# Patient Record
Sex: Male | Born: 1940 | Race: White | Hispanic: No | Marital: Married | Smoking: Former smoker
Health system: Southern US, Community
[De-identification: ages and names within clinical notes are randomized; demographics above are authoritative.]

## PROBLEM LIST (undated history)

## (undated) DIAGNOSIS — C801 Malignant (primary) neoplasm, unspecified: Secondary | ICD-10-CM

## (undated) HISTORY — PX: COLON SURGERY: SHX602

---

## 2004-07-09 ENCOUNTER — Emergency Department (HOSPITAL_COMMUNITY): Admission: EM | Admit: 2004-07-09 | Discharge: 2004-07-09 | Payer: Self-pay | Admitting: Emergency Medicine

## 2004-07-10 ENCOUNTER — Inpatient Hospital Stay (HOSPITAL_COMMUNITY): Admission: RE | Admit: 2004-07-10 | Discharge: 2004-07-14 | Payer: Self-pay | Admitting: General Surgery

## 2004-07-29 ENCOUNTER — Encounter (HOSPITAL_COMMUNITY): Admission: RE | Admit: 2004-07-29 | Discharge: 2004-08-07 | Payer: Self-pay | Admitting: Oncology

## 2004-07-29 ENCOUNTER — Encounter: Admission: RE | Admit: 2004-07-29 | Discharge: 2004-08-07 | Payer: Self-pay | Admitting: Oncology

## 2004-10-27 ENCOUNTER — Encounter (HOSPITAL_COMMUNITY): Admission: RE | Admit: 2004-10-27 | Discharge: 2004-11-06 | Payer: Self-pay | Admitting: Oncology

## 2004-10-27 ENCOUNTER — Ambulatory Visit (HOSPITAL_COMMUNITY): Payer: Self-pay | Admitting: Oncology

## 2004-10-27 ENCOUNTER — Encounter: Admission: RE | Admit: 2004-10-27 | Discharge: 2004-11-06 | Payer: Self-pay | Admitting: Oncology

## 2004-12-01 ENCOUNTER — Encounter (HOSPITAL_COMMUNITY): Admission: RE | Admit: 2004-12-01 | Discharge: 2004-12-31 | Payer: Self-pay | Admitting: Oncology

## 2004-12-01 ENCOUNTER — Encounter: Admission: RE | Admit: 2004-12-01 | Discharge: 2004-12-01 | Payer: Self-pay | Admitting: Oncology

## 2005-02-08 ENCOUNTER — Ambulatory Visit (HOSPITAL_COMMUNITY): Admission: RE | Admit: 2005-02-08 | Discharge: 2005-02-08 | Payer: Self-pay | Admitting: Family Medicine

## 2005-03-01 ENCOUNTER — Ambulatory Visit (HOSPITAL_COMMUNITY): Payer: Self-pay | Admitting: Oncology

## 2005-03-01 ENCOUNTER — Encounter: Admission: RE | Admit: 2005-03-01 | Discharge: 2005-03-01 | Payer: Self-pay | Admitting: Oncology

## 2005-03-01 ENCOUNTER — Encounter (HOSPITAL_COMMUNITY): Admission: RE | Admit: 2005-03-01 | Discharge: 2005-03-31 | Payer: Self-pay | Admitting: Oncology

## 2005-05-24 ENCOUNTER — Ambulatory Visit (HOSPITAL_COMMUNITY): Payer: Self-pay | Admitting: Oncology

## 2005-05-24 ENCOUNTER — Encounter: Admission: RE | Admit: 2005-05-24 | Discharge: 2005-05-24 | Payer: Self-pay | Admitting: Oncology

## 2005-05-24 ENCOUNTER — Encounter (HOSPITAL_COMMUNITY): Admission: RE | Admit: 2005-05-24 | Discharge: 2005-06-23 | Payer: Self-pay | Admitting: Oncology

## 2005-07-30 ENCOUNTER — Encounter: Admission: RE | Admit: 2005-07-30 | Discharge: 2005-08-07 | Payer: Self-pay | Admitting: Oncology

## 2005-07-30 ENCOUNTER — Encounter (HOSPITAL_COMMUNITY): Admission: RE | Admit: 2005-07-30 | Discharge: 2005-08-07 | Payer: Self-pay | Admitting: Oncology

## 2005-07-30 ENCOUNTER — Ambulatory Visit (HOSPITAL_COMMUNITY): Payer: Self-pay | Admitting: Oncology

## 2006-01-19 ENCOUNTER — Ambulatory Visit (HOSPITAL_COMMUNITY): Admission: RE | Admit: 2006-01-19 | Discharge: 2006-01-19 | Payer: Self-pay | Admitting: General Surgery

## 2006-01-21 ENCOUNTER — Ambulatory Visit (HOSPITAL_COMMUNITY): Payer: Self-pay | Admitting: Oncology

## 2006-01-21 ENCOUNTER — Encounter: Admission: RE | Admit: 2006-01-21 | Discharge: 2006-01-21 | Payer: Self-pay | Admitting: Oncology

## 2006-01-21 ENCOUNTER — Encounter (HOSPITAL_COMMUNITY): Admission: RE | Admit: 2006-01-21 | Discharge: 2006-02-20 | Payer: Self-pay | Admitting: Oncology

## 2006-04-22 ENCOUNTER — Ambulatory Visit (HOSPITAL_COMMUNITY): Payer: Self-pay | Admitting: Oncology

## 2006-04-22 ENCOUNTER — Encounter: Admission: RE | Admit: 2006-04-22 | Discharge: 2006-04-22 | Payer: Self-pay | Admitting: Oncology

## 2006-04-22 ENCOUNTER — Encounter (HOSPITAL_COMMUNITY): Admission: RE | Admit: 2006-04-22 | Discharge: 2006-05-22 | Payer: Self-pay | Admitting: Oncology

## 2006-07-15 ENCOUNTER — Ambulatory Visit (HOSPITAL_COMMUNITY): Payer: Self-pay | Admitting: Oncology

## 2006-07-15 ENCOUNTER — Encounter: Admission: RE | Admit: 2006-07-15 | Discharge: 2006-08-05 | Payer: Self-pay | Admitting: Oncology

## 2006-07-15 ENCOUNTER — Encounter (HOSPITAL_COMMUNITY): Admission: RE | Admit: 2006-07-15 | Discharge: 2006-08-05 | Payer: Self-pay | Admitting: Oncology

## 2006-10-14 ENCOUNTER — Ambulatory Visit (HOSPITAL_COMMUNITY): Payer: Self-pay | Admitting: Oncology

## 2006-10-14 ENCOUNTER — Encounter (HOSPITAL_COMMUNITY): Admission: RE | Admit: 2006-10-14 | Discharge: 2006-11-07 | Payer: Self-pay | Admitting: Oncology

## 2007-01-13 ENCOUNTER — Encounter (HOSPITAL_COMMUNITY): Admission: RE | Admit: 2007-01-13 | Discharge: 2007-02-12 | Payer: Self-pay | Admitting: Oncology

## 2007-01-13 ENCOUNTER — Ambulatory Visit (HOSPITAL_COMMUNITY): Payer: Self-pay | Admitting: Oncology

## 2007-04-14 ENCOUNTER — Encounter (HOSPITAL_COMMUNITY): Admission: RE | Admit: 2007-04-14 | Discharge: 2007-05-14 | Payer: Self-pay | Admitting: Oncology

## 2007-04-14 ENCOUNTER — Ambulatory Visit (HOSPITAL_COMMUNITY): Payer: Self-pay | Admitting: Oncology

## 2007-07-06 ENCOUNTER — Emergency Department (HOSPITAL_COMMUNITY): Admission: EM | Admit: 2007-07-06 | Discharge: 2007-07-06 | Payer: Self-pay | Admitting: Emergency Medicine

## 2007-07-17 ENCOUNTER — Ambulatory Visit (HOSPITAL_COMMUNITY): Payer: Self-pay | Admitting: Oncology

## 2007-07-17 ENCOUNTER — Encounter (HOSPITAL_COMMUNITY): Admission: RE | Admit: 2007-07-17 | Discharge: 2007-08-08 | Payer: Self-pay | Admitting: Oncology

## 2007-08-28 ENCOUNTER — Encounter (HOSPITAL_COMMUNITY): Admission: RE | Admit: 2007-08-28 | Discharge: 2007-09-27 | Payer: Self-pay | Admitting: Neurosurgery

## 2007-09-28 ENCOUNTER — Encounter (HOSPITAL_COMMUNITY): Admission: RE | Admit: 2007-09-28 | Discharge: 2007-10-28 | Payer: Self-pay | Admitting: Neurosurgery

## 2007-10-16 ENCOUNTER — Encounter (HOSPITAL_COMMUNITY): Admission: RE | Admit: 2007-10-16 | Discharge: 2007-11-08 | Payer: Self-pay | Admitting: Oncology

## 2007-10-16 ENCOUNTER — Ambulatory Visit (HOSPITAL_COMMUNITY): Payer: Self-pay | Admitting: Oncology

## 2007-11-07 ENCOUNTER — Ambulatory Visit (HOSPITAL_COMMUNITY): Admission: RE | Admit: 2007-11-07 | Discharge: 2007-11-07 | Payer: Self-pay | Admitting: Neurosurgery

## 2007-11-10 ENCOUNTER — Encounter (HOSPITAL_COMMUNITY): Admission: RE | Admit: 2007-11-10 | Discharge: 2007-12-10 | Payer: Self-pay | Admitting: Neurosurgery

## 2008-01-17 ENCOUNTER — Encounter (HOSPITAL_COMMUNITY): Admission: RE | Admit: 2008-01-17 | Discharge: 2008-02-06 | Payer: Self-pay | Admitting: Oncology

## 2008-01-17 ENCOUNTER — Ambulatory Visit (HOSPITAL_COMMUNITY): Payer: Self-pay | Admitting: Oncology

## 2008-02-12 ENCOUNTER — Encounter (HOSPITAL_COMMUNITY): Admission: RE | Admit: 2008-02-12 | Discharge: 2008-03-13 | Payer: Self-pay | Admitting: Oncology

## 2008-04-17 ENCOUNTER — Encounter (HOSPITAL_COMMUNITY): Admission: RE | Admit: 2008-04-17 | Discharge: 2008-05-17 | Payer: Self-pay | Admitting: Oncology

## 2008-04-17 ENCOUNTER — Ambulatory Visit (HOSPITAL_COMMUNITY): Payer: Self-pay | Admitting: Oncology

## 2008-07-19 ENCOUNTER — Ambulatory Visit (HOSPITAL_COMMUNITY): Payer: Self-pay | Admitting: Oncology

## 2008-07-19 ENCOUNTER — Encounter (HOSPITAL_COMMUNITY): Admission: RE | Admit: 2008-07-19 | Discharge: 2008-08-05 | Payer: Self-pay | Admitting: Oncology

## 2008-07-30 IMAGING — CR DG ELBOW COMPLETE 3+V*L*
2 series · 2 of 2 positions shown · non-contrast
Comparison: None

CLINICAL DATA: Posterior left elbow pain, recent trauma, past
history colon cancer

LEFT ELBOW - COMPLETE 3+ VIEWS

[view not recorded (1 of 2)]
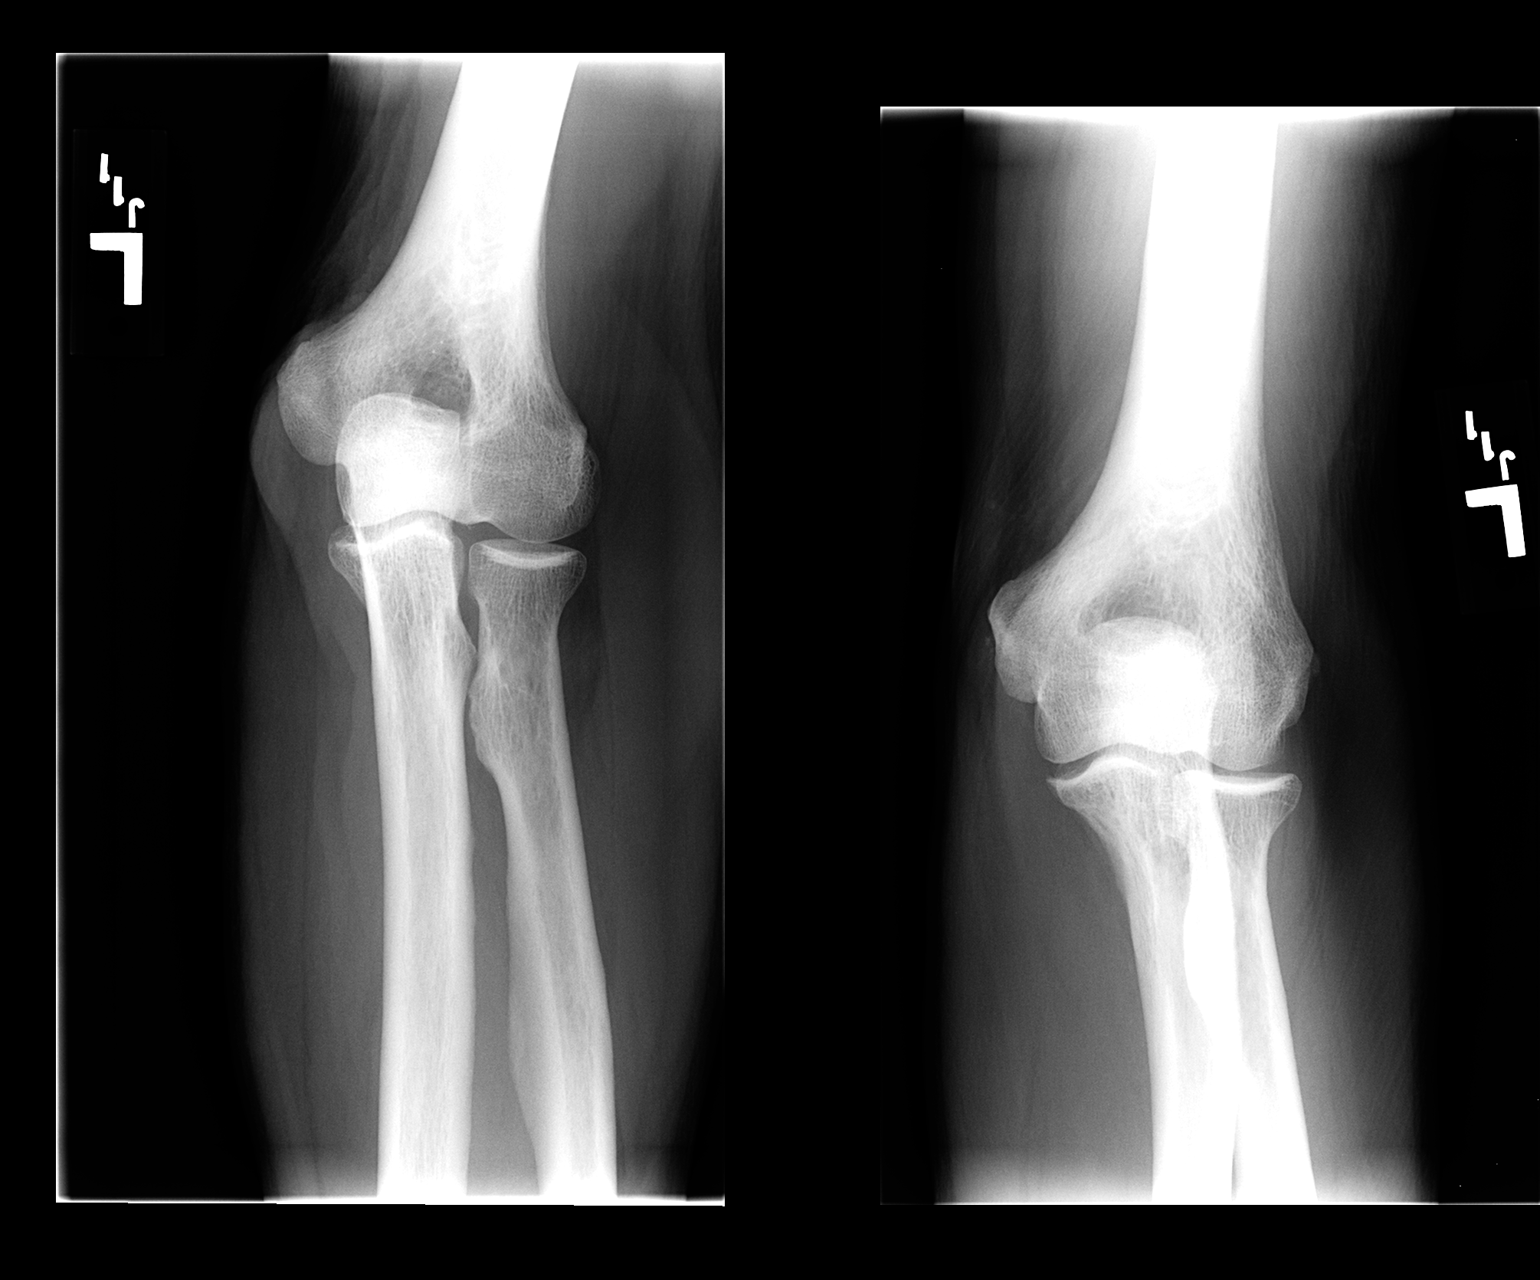

[view not recorded (2 of 2)]
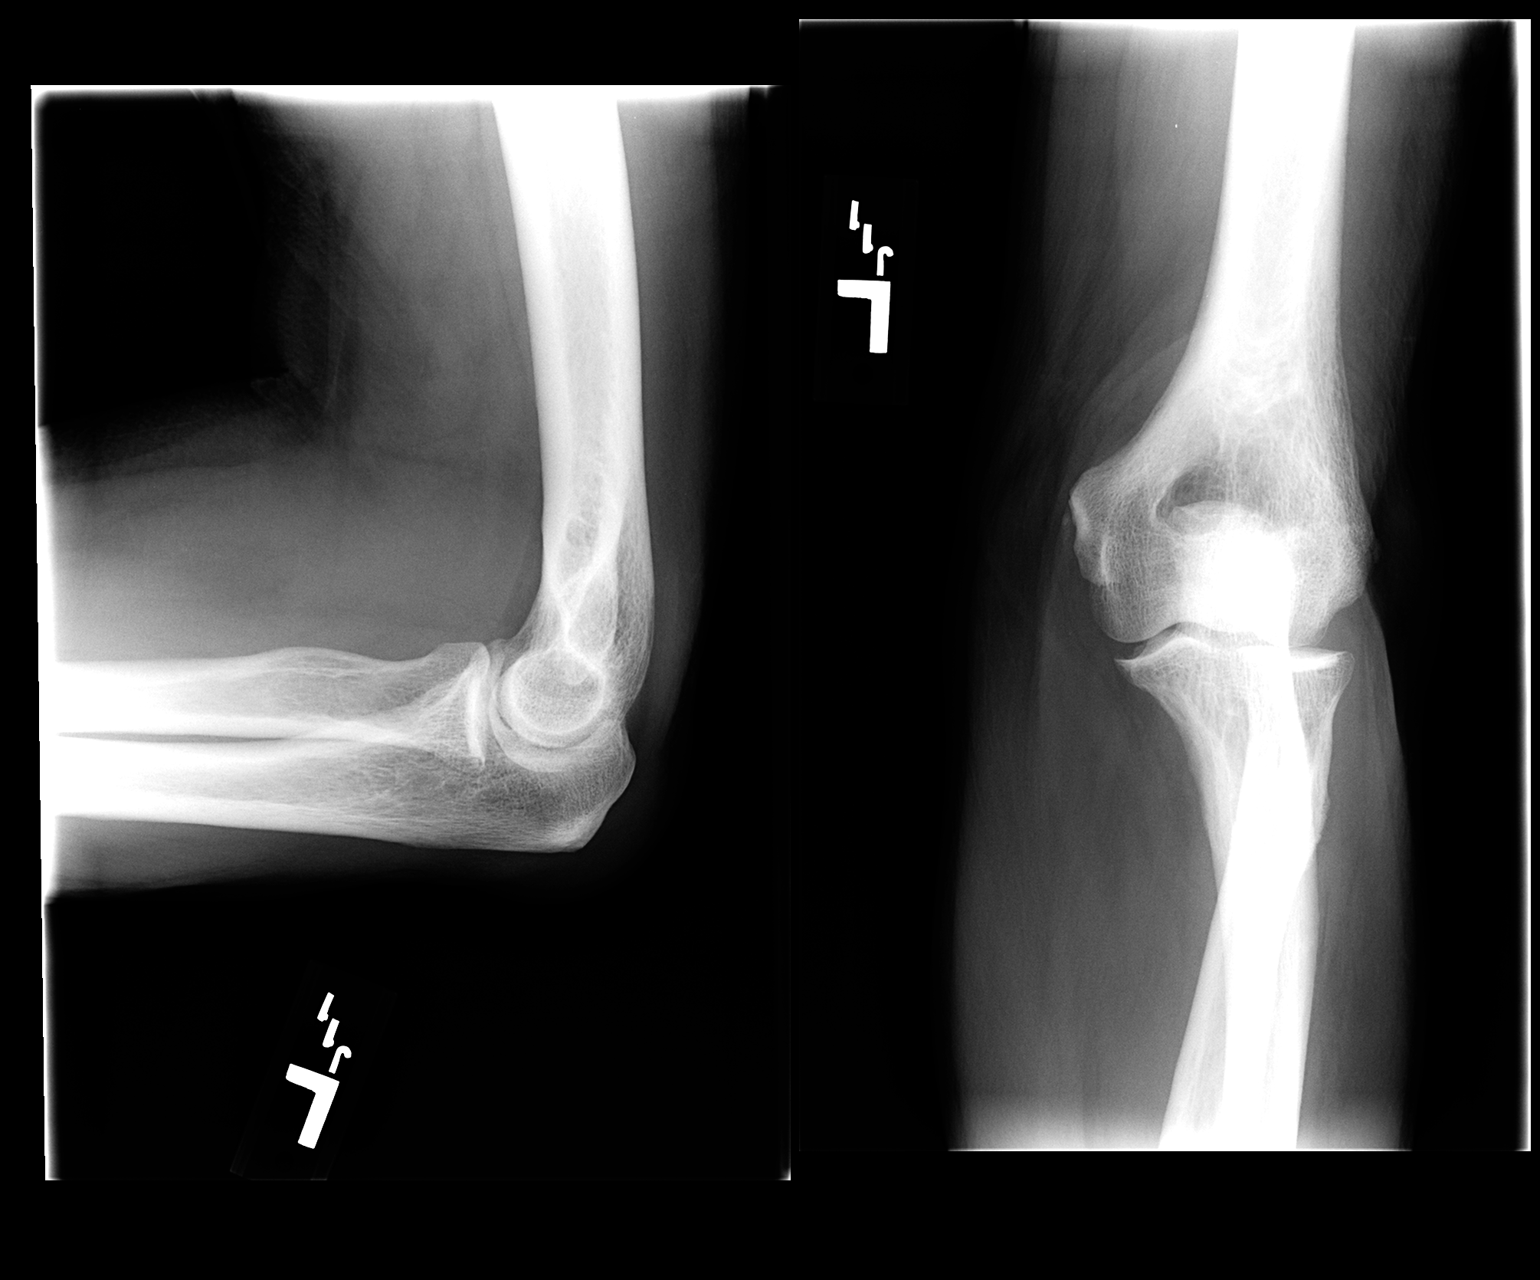

[2 of 2 positions shown; findings below may reference images not displayed]

FINDINGS: Bone mineralization normal.
Joint spaces preserved.
No fracture, dislocation, or bone destruction.
No joint effusion.
IMPRESSION: No acute bony abnormalities.

## 2008-08-22 ENCOUNTER — Ambulatory Visit: Payer: Self-pay | Admitting: Internal Medicine

## 2008-09-04 ENCOUNTER — Encounter: Payer: Self-pay | Admitting: Internal Medicine

## 2008-09-04 ENCOUNTER — Ambulatory Visit (HOSPITAL_COMMUNITY): Admission: RE | Admit: 2008-09-04 | Discharge: 2008-09-04 | Payer: Self-pay | Admitting: Internal Medicine

## 2008-09-04 ENCOUNTER — Ambulatory Visit: Payer: Self-pay | Admitting: Internal Medicine

## 2008-10-18 ENCOUNTER — Encounter (HOSPITAL_COMMUNITY): Admission: RE | Admit: 2008-10-18 | Discharge: 2008-11-06 | Payer: Self-pay | Admitting: Oncology

## 2008-10-18 ENCOUNTER — Ambulatory Visit (HOSPITAL_COMMUNITY): Payer: Self-pay | Admitting: Oncology

## 2009-01-17 ENCOUNTER — Encounter (HOSPITAL_COMMUNITY): Admission: RE | Admit: 2009-01-17 | Discharge: 2009-02-16 | Payer: Self-pay | Admitting: Oncology

## 2009-01-17 ENCOUNTER — Ambulatory Visit (HOSPITAL_COMMUNITY): Payer: Self-pay | Admitting: Oncology

## 2009-03-14 ENCOUNTER — Ambulatory Visit (HOSPITAL_COMMUNITY): Payer: Self-pay | Admitting: Oncology

## 2009-04-30 ENCOUNTER — Encounter (HOSPITAL_COMMUNITY): Admission: RE | Admit: 2009-04-30 | Discharge: 2009-05-30 | Payer: Self-pay | Admitting: Oncology

## 2009-04-30 ENCOUNTER — Ambulatory Visit (HOSPITAL_COMMUNITY): Payer: Self-pay | Admitting: Oncology

## 2009-07-30 ENCOUNTER — Ambulatory Visit (HOSPITAL_COMMUNITY): Payer: Self-pay | Admitting: Oncology

## 2009-07-30 ENCOUNTER — Encounter (HOSPITAL_COMMUNITY): Admission: RE | Admit: 2009-07-30 | Discharge: 2009-08-06 | Payer: Self-pay | Admitting: Oncology

## 2010-01-16 ENCOUNTER — Ambulatory Visit (HOSPITAL_COMMUNITY): Payer: Self-pay | Admitting: Oncology

## 2010-03-24 ENCOUNTER — Encounter (HOSPITAL_COMMUNITY): Admission: RE | Admit: 2010-03-24 | Discharge: 2010-04-23 | Payer: Self-pay | Admitting: Oncology

## 2010-03-24 ENCOUNTER — Ambulatory Visit (HOSPITAL_COMMUNITY): Payer: Self-pay | Admitting: Oncology

## 2010-04-27 ENCOUNTER — Encounter: Payer: Self-pay | Admitting: Internal Medicine

## 2010-06-26 ENCOUNTER — Encounter (HOSPITAL_COMMUNITY): Admission: RE | Admit: 2010-06-26 | Discharge: 2010-07-26 | Payer: Self-pay | Admitting: Oncology

## 2010-06-26 ENCOUNTER — Ambulatory Visit (HOSPITAL_COMMUNITY): Payer: Self-pay | Admitting: Oncology

## 2010-12-09 NOTE — Letter (Signed)
Summary: office progress note-dr. Mariel Sleet  office progress note-dr. Mariel Sleet   Imported By: Rosine Beat 04/27/2010 14:45:20  _____________________________________________________________________  External Attachment:    Type:   Image     Comment:   External Document

## 2011-01-25 LAB — CBC
Hemoglobin: 15.5 g/dL (ref 13.0–17.0)
MCV: 91.2 fL (ref 78.0–100.0)
Platelets: 261 10*3/uL (ref 150–400)

## 2011-01-25 LAB — DIFFERENTIAL
Eosinophils Absolute: 0.1 10*3/uL (ref 0.0–0.7)
Lymphs Abs: 1.2 10*3/uL (ref 0.7–4.0)
Monocytes Absolute: 0.5 10*3/uL (ref 0.1–1.0)
Monocytes Relative: 10 % (ref 3–12)

## 2011-01-25 LAB — COMPREHENSIVE METABOLIC PANEL
Albumin: 3.7 g/dL (ref 3.5–5.2)
Creatinine, Ser: 1.18 mg/dL (ref 0.4–1.5)
GFR calc Af Amer: >60 mL/min (ref >60)
Potassium: 4.1 mEq/L (ref 3.5–5.1)

## 2011-01-25 LAB — CEA: CEA: 4.3 ng/mL (ref 0.0–5.0)

## 2011-02-15 LAB — CEA: CEA: 6.3 ng/mL — ABNORMAL HIGH (ref 0.0–5.0)

## 2011-02-18 LAB — COMPREHENSIVE METABOLIC PANEL
ALT: 28 U/L (ref 0–53)
Alkaline Phosphatase: 50 U/L (ref 39–117)
Calcium: 9.4 mg/dL (ref 8.4–10.5)
GFR calc non Af Amer: 57 mL/min — ABNORMAL LOW (ref >60)
Sodium: 138 mEq/L (ref 135–145)

## 2011-02-18 LAB — CBC
Hemoglobin: 16.3 g/dL (ref 13.0–17.0)
RBC: 4.88 MIL/uL (ref 4.22–5.81)
WBC: 6.2 10*3/uL (ref 4.0–10.5)

## 2011-02-18 LAB — DIFFERENTIAL
Basophils Absolute: 0 10*3/uL (ref 0.0–0.1)
Basophils Relative: 1 % (ref 0–1)
Eosinophils Absolute: 0.1 10*3/uL (ref 0.0–0.7)
Lymphocytes Relative: 19 % (ref 12–46)
Lymphs Abs: 1.2 10*3/uL (ref 0.7–4.0)
Monocytes Absolute: 0.5 10*3/uL (ref 0.1–1.0)

## 2011-02-18 LAB — CEA: CEA: 6.4 ng/mL — ABNORMAL HIGH (ref 0.0–5.0)

## 2011-03-23 NOTE — H&P (Signed)
Albert Stewart, Albert Stewart                ACCOUNT NO.:  [id number]   MEDICAL RECORD NO.:  [id number]          PATIENT TYPE:  AMB   LOCATION:  DAY                           FACILITY:  [facility]   PHYSICIAN:  R. Roetta Sessions, M.D. DATE OF BIRTH:  06-13-1941   DATE OF ADMISSION:  DATE OF DISCHARGE:  LH                              HISTORY & PHYSICAL   CHIEF COMPLAINT:  Hematochezia and history of colon cancer.   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 Lovell Sheehan.  He had limited stage disease.  He  has been followed by Dr. Mariel Sleet.  He tells me his last CEA was up  somewhat at 7.3.  He had a followup colonoscopy in 2007, without  apparently any significant findings (I do not have a study procedure  note for review at this time).  He tells me just recently he passed some  blood per rectum when having a bowel movement, generally has 1 to 2  bowel movements daily.  He readily admits 2-3 drinks vodka beverages in  the evening.  He stopped drinking 5 days ago to see if this will make a  difference.  He had 1 episode of rectal bleeding and is most concerned  about the cause.  Apparently, he is due for followup colonoscopy  sometime next year, but has come to me to have his colon checked now.   PAST MEDICAL HISTORY:  Anxiety, neurosis, history of a cecal cancer,  found in 2005 as described above.  Staging was apparently T2 N0 M0 from  2005.  He is also status post appendectomy.   CURRENT MEDICATIONS:  None.   FAMILY HISTORY:  Father died with CVA at age 23.  Mother is alive and  relatively good health at 72.  No history or chronic GI or liver  illness.   SOCIAL HISTORY:  The patient is married.  He retired from a Management consultant. He smokes 1 pack cigarettes per day. He has 2-3 vodka beverages  every night.   REVIEW OF SYSTEMS:  No chest pain and dyspnea on exertion.   No  odynophagia, dysphagia, reflux symptoms, nausea, or vomiting.  He denies  abdominal pain.   PHYSICAL EXAMINATION:  GENERAL:  A pleasant 67-year gentleman resting  comfortably.  VITAL SIGNS:  Weight 200, height 5 feet 8 inches, temperature 98.4, BP  elevated at 190/98 today, and pulse 60.  SKIN:  Warm and dry.  There is no jaundice.  HEENT:  No scleral icterus.  CHEST:  Lungs are clear to auscultation.  HEART:  Regular rate and rhythm without murmur, gallop, or rub.  ABDOMEN:  Nondistended.  Well-healed laparotomy scar present.  Positive  bowel sounds, soft, and nontender without appreciable mass or megaly.  EXTREMITIES:  No edema.  RECTAL:  Deferred at the time of colonoscopy.   IMPRESSION:  Albert Stewart is a pleasant 70 year old gentleman with a  history of a cecal carcinoma status post right hemicolectomy in 2005,  with apparent negative colonoscopy in 2007.  He has had some recent  hematochezia.  I have told Mr. Carlisi he ought to go ahead have a  colonoscopy now.  Risks, benefits, alternatives, and limitations have  been reviewed.  Questions answered.  He is agreeable.  I will plan for  colonoscopy in the next week at [facility].  No make further  recommendations.      Jonathon Bellows, M.D.  Electronically Signed     RMR/MEDQ  D:  08/22/2008  T:  08/23/2008  Job:  [id number]   cc:   Madelin Rear. Sherwood Gambler, MD  Fax: [phone]   Ladona Horns. Mariel Sleet, MD  Fax: [phone]

## 2011-03-23 NOTE — Op Note (Signed)
Albert Stewart, Albert Stewart                ACCOUNT NO.:  [id number]   MEDICAL RECORD NO.:  [id number]          PATIENT TYPE:  AMB   LOCATION:  DAY                           FACILITY:  [facility]   PHYSICIAN:  R. Roetta Sessions, M.D. DATE OF BIRTH:  [birth year]   DATE OF PROCEDURE:  DATE OF DISCHARGE:                               OPERATIVE REPORT   Colonoscopy and hot snare polypectomy.   INDICATIONS FOR PROCEDURE:  A 70 year old gentleman with a history of  cecal carcinoma status post right hemicolectomy in 2005 with a negative  colonoscopy in 2007.  He has had some intermittent right hematochezia,  recently came to see me for further evaluation.  Colonoscopy is now  being done.  Risks, benefits, alternatives, limitations have been  reviewed, questions answered.  He currently takes no medications and has  no medication allergies.   PROCEDURE NOTE:  O2 saturation, blood pressure, pulse, respirations were  monitored throughout the entire procedure.   CONSCIOUS SEDATION:  Versed 5 mg IV, Demerol 100 mg IV in divided doses.   INSTRUMENT:  Pentax video chip system.   FINDINGS:  Digital rectal exam revealed no abnormalities.  Endoscopic  findings:  The prep was adequate.  Colon:  The colonic mucosa was  surveyed from the rectosigmoid junction all the level way to the  anastomosis with small bowel.  The cecum and ileocecal valve were gone.  Surgical anastomosis appeared normal and intact.  Please see photos.  From this level, the scope was slowly withdrawn.  All previously  mentioned mucosal surfaces were again seen.  The patient had scattered  left-sided diverticula and some fine submucosal petechiae around a  couple of tics.  There was a single 6-mm angry pedunculated polyp in the  mid descending colon which was hot snared, recovered through the scope.  The remainder of the colonic mucosa appeared normal.  Scope was pulled  down the rectum.  A thorough examination of the rectal mucosa including  retroflexed view of the anal verge on fluoroscopy the anal canal  demonstrated friable anal canal.  Otherwise, rectal mucosa appeared  unremarkable.  The patient tolerated the procedure well, was reactive to  endoscopy.   IMPRESSION:  1. Friable anal canal, otherwise normal rectum.  2. Left-sided diverticula, some submucosal petechiae around a couple      of the tics of doubtful clinical significance, polyp mid descending      colon status post hot snare removal, status post right      hemicolectomy described above.   I suspect the patient experienced benign anorectal bleeding.   This maybe in part related to excessive consumption of vodka each  evening.   RECOMMENDATIONS:  1. I asked him to come back on his daily consumption of alcohol,      Anusol-HC Suppositories one per rectum at bedtime x10 days.  2. Adequate daily fiber intake.  3. Followup on path.  No aspirin or arthritis medications for 5 days.  4. Further recommendations to follow.      Albert Stewart, M.D.  Electronically Signed     RMR/MEDQ  D:  09/04/2008  T:  09/05/2008  Job:  [id number]   cc:   Madelin Rear. Sherwood Gambler, MD  Fax: [phone]   Ladona Horns. Mariel Sleet, MD  Fax: [phone]

## 2011-03-26 NOTE — Discharge Summary (Signed)
NAME:  Albert Stewart, Albert Stewart NO.:  [id number]   MEDICAL RECORD NO.:  [id number]                   PATIENT TYPE:  INP   LOCATION:  A309                                 FACILITY:  [facility]   PHYSICIAN:  Dalia Heading, M.D.               DATE OF BIRTH:  Nov 08, 1941   DATE OF ADMISSION:  07/10/2004  DATE OF DISCHARGE:  07/14/2004                                 DISCHARGE SUMMARY   HOSPITAL COURSE SUMMARY:  The patient is a 70 year old white male who  presented to the endoscopy suite for a colonoscopy.  Interestingly, he had  had right lower-quadrant abdominal pain and hematochezia 24 hours earlier.  He underwent a colonoscopy and was found to have a large cecal mass with  enterocolic intussusception.  This was reduced using the colonoscope.  After  extensive discussion with the patient and wife, it was elected to proceed  with a right hemicolectomy.  This was done also on July 10, 2004.  He  tolerated the procedure well.  His postoperative course was for the most  part unremarkable.  His diet was advanced without difficulty.   Final pathology revealed a T2, N0, M0, colon carcinoma.   His diet was advanced without difficulty once his bowel function returned.  He was discharged home on July 14, 2004, in good and improving  condition.   DISCHARGE INSTRUCTIONS:  The patient was to follow up with Dr. Franky Macho  in one week.   DISCHARGE MEDICATIONS:  1.  Vicodin one to two tablets p.o. q.4h. p.r.n. pain.  2.  Of note was the fact that his preoperative CEA level was elevated at      6.1.   PRINCIPAL DIAGNOSES:  1.  Cecal mass.  2.  T2, N0, M0 colon carcinoma.   PRINCIPAL PROCEDURE:  Colonoscopy with biopsy on July 10, 2004, and  right hemicolectomy on July 10, 2004.      MAJ/MEDQ  D:  07/24/2004  T:  07/24/2004  Job:  [id number]   cc:   Madelin Rear. Sherwood Gambler, M.D.  [id number]  [place]  [place] [address]  Fax: [phone]

## 2011-08-02 LAB — DIFFERENTIAL
Basophils Absolute: 0
Basophils Relative: 0
Eosinophils Absolute: 0.6
Eosinophils Relative: 9 — ABNORMAL HIGH
Lymphocytes Relative: 21
Lymphs Abs: 1.4
Neutrophils Relative %: 60

## 2011-08-02 LAB — CEA: CEA: 6.2 — ABNORMAL HIGH

## 2011-08-02 LAB — COMPREHENSIVE METABOLIC PANEL
Albumin: 3.7
Calcium: 8.9
Creatinine, Ser: 1.11
GFR calc non Af Amer: >60
Glucose, Bld: 145 — ABNORMAL HIGH
Potassium: 3.8
Total Bilirubin: 0.5

## 2011-08-02 LAB — CBC
HCT: 48.7
MCV: 96.9
RDW: 14.5
WBC: 6.6

## 2011-08-05 LAB — CEA: CEA: 6.5 — ABNORMAL HIGH

## 2011-08-16 LAB — CEA: CEA: 5.7 — ABNORMAL HIGH

## 2013-08-24 ENCOUNTER — Encounter: Payer: Self-pay | Admitting: Internal Medicine

## 2015-12-26 ENCOUNTER — Ambulatory Visit: Payer: Self-pay | Admitting: Urology

## 2016-01-30 ENCOUNTER — Ambulatory Visit: Payer: Self-pay | Admitting: Urology

## 2016-07-07 ENCOUNTER — Other Ambulatory Visit (HOSPITAL_COMMUNITY): Payer: Self-pay | Admitting: Family Medicine

## 2016-07-07 ENCOUNTER — Ambulatory Visit (HOSPITAL_COMMUNITY)
Admission: RE | Admit: 2016-07-07 | Discharge: 2016-07-07 | Disposition: A | Discharge status: Home / Self Care | Payer: Medicare Other | Source: Ambulatory Visit | Attending: Family Medicine | Admitting: Family Medicine

## 2016-07-07 DIAGNOSIS — R0789 Other chest pain: Secondary | ICD-10-CM

## 2016-07-07 DIAGNOSIS — R079 Chest pain, unspecified: Secondary | ICD-10-CM | POA: Insufficient documentation

## 2016-07-20 ENCOUNTER — Emergency Department (HOSPITAL_COMMUNITY)
Admission: EM | Admit: 2016-07-20 | Discharge: 2016-07-20 | Disposition: A | Discharge status: Home / Self Care | Payer: Medicare Other | Attending: Emergency Medicine | Admitting: Emergency Medicine

## 2016-07-20 ENCOUNTER — Encounter (HOSPITAL_COMMUNITY): Payer: Self-pay | Admitting: *Deleted

## 2016-07-20 DIAGNOSIS — Z85038 Personal history of other malignant neoplasm of large intestine: Secondary | ICD-10-CM | POA: Insufficient documentation

## 2016-07-20 DIAGNOSIS — Z87891 Personal history of nicotine dependence: Secondary | ICD-10-CM | POA: Insufficient documentation

## 2016-07-20 DIAGNOSIS — R339 Retention of urine, unspecified: Secondary | ICD-10-CM | POA: Diagnosis present

## 2016-07-20 DIAGNOSIS — R1030 Lower abdominal pain, unspecified: Secondary | ICD-10-CM | POA: Insufficient documentation

## 2016-07-20 HISTORY — DX: Malignant (primary) neoplasm, unspecified: C80.1

## 2016-07-20 LAB — URINALYSIS, ROUTINE W REFLEX MICROSCOPIC
Bilirubin Urine: NEGATIVE
Glucose, UA: NEGATIVE mg/dL
Hgb urine dipstick: NEGATIVE
Ketones, ur: NEGATIVE mg/dL
Leukocytes, UA: NEGATIVE
Nitrite: NEGATIVE
Protein, ur: NEGATIVE mg/dL
Specific Gravity, Urine: 1.01 (ref 1.005–1.030)
pH: 6 (ref 5.0–8.0)

## 2016-07-20 MED ORDER — TAMSULOSIN HCL 0.4 MG PO CAPS: 0.4000 mg | ORAL_CAPSULE | Freq: Every day | ORAL | 0 refills | Status: AC

## 2016-07-20 NOTE — ED Triage Notes (Signed)
Pt comes in with urinary retention starting this morning. Pt states he is having pain and pressure in his lower abdomen. Denies any prostate problems.    Pt unable to sit down in triage. Pt is uncomfortable.

## 2016-07-20 NOTE — ED Provider Notes (Signed)
[place] DEPT Provider Note   CSN: [id number] Arrival date & time: 07/20/16  1232  By signing my name below, I, Shanna Cisco, attest that this documentation has been prepared under the direction and in the presence of Virgel Manifold, MD. Electronically Signed: Shanna Cisco, ED Scribe. 07/20/16. 1:22 PM.  History   Chief Complaint Chief Complaint  Patient presents with  . Urinary Retention   The history is provided by the patient. No language interpreter was used.   HPI Comments:  KORDALE OBA is a 75 y.o. male who presents to the Emergency Department complaining of urinary retention, which started 3 hours ago. Pt reports that the last time he urinated was this morning around 0600 and believes he emptied fully. Associated symptoms include feeling like he's about to burst and abdominal pain. Pain worsens with sitting or lying. He reports that he took super beta prostate today. Denies nausea or previous experience with urinary retention.   Past Medical History:  Diagnosis Date  . Cancer [facility])    colon    There are no active problems to display for this patient.   Past Surgical History:  Procedure Laterality Date  . COLON SURGERY         Home Medications    Prior to Admission medications   Not on File    Family History No family history on file.  Social History Social History  Substance Use Topics  . Smoking status: Former Research scientist (life sciences)  . Smokeless tobacco: Never Used  . Alcohol use Yes     Comment: daily 1/2 pint liquor     Allergies   Review of patient's allergies indicates no known allergies.   Review of Systems Review of Systems  Gastrointestinal: Positive for abdominal pain. Negative for nausea.  Genitourinary: Positive for difficulty urinating ( Retention).  All other systems reviewed and are negative.    Physical Exam Updated Vital Signs Ht 5\' 9"  (1.753 m)   Wt 193 lb (87.5 kg)   BMI 28.50 kg/m   Physical Exam  Constitutional: He is oriented  to person, place, and time. He appears well-developed and well-nourished.  Appears uncomfortable; shuffling gait.  HENT:  Head: Normocephalic and atraumatic.  Right Ear: External ear normal.  Left Ear: External ear normal.  Eyes: Conjunctivae and EOM are normal. Pupils are equal, round, and reactive to light.  Neck: Normal range of motion and phonation normal. Neck supple.  Cardiovascular: Normal rate, regular rhythm and normal heart sounds.   Pulmonary/Chest: Effort normal. He has wheezes ( bilaterally). He exhibits no bony tenderness.  Abdominal: Soft. There is tenderness.  Genitourinary:  Genitourinary Comments: Palpable distended bladder; tender.   Musculoskeletal: Normal range of motion.  Neurological: He is alert and oriented to person, place, and time. No cranial nerve deficit or sensory deficit. He exhibits normal muscle tone. Coordination normal.  Skin: Skin is warm, dry and intact.  Psychiatric: He has a normal mood and affect. His behavior is normal. Judgment and thought content normal.  Nursing note and vitals reviewed.    ED Treatments / Results  DIAGNOSTIC STUDIES:  Oxygen Saturation is 95% on room air, normal by my interpretation.    COORDINATION OF CARE:  1:22 PM Discussed treatment plan with pt at bedside, which includes catheter, and pt agreed to plan. Pt advised to follow up with urology.  Labs (all labs ordered are listed, but only abnormal results are displayed) Labs Reviewed  URINALYSIS, ROUTINE W REFLEX MICROSCOPIC (NOT AT [facility])    EKG  EKG Interpretation None       Radiology No results found.  Procedures Procedures (including critical care time)  Medications Ordered in ED Medications - No data to display   Initial Impression / Assessment and Plan / ED Course  I have reviewed the triage vital signs and the nursing notes.  Pertinent labs & imaging results that were available during my care of the patient were reviewed by me and considered  in my medical decision making (see chart for details).  Clinical Course    75 year old male with acute urinary retention. He feels significantly better after Foley catheter was placed. He will be started on Flomax. Urinalysis is unremarkable. Return precautions are discussed. Outpatient urology follow-up otherwise.  Final Clinical Impressions(s) / ED Diagnoses   Final diagnoses:  Urinary retention    New Prescriptions New Prescriptions   No medications on file  I personally preformed the services scribed in my presence. The recorded information has been reviewed is accurate. Virgel Manifold, MD.     Virgel Manifold, MD 07/25/16 [phone]

## 2016-07-30 ENCOUNTER — Ambulatory Visit (INDEPENDENT_AMBULATORY_CARE_PROVIDER_SITE_OTHER): Payer: Medicare Other | Admitting: Urology

## 2016-07-30 DIAGNOSIS — N401 Enlarged prostate with lower urinary tract symptoms: Secondary | ICD-10-CM | POA: Diagnosis not present

## 2016-07-30 DIAGNOSIS — R310 Gross hematuria: Secondary | ICD-10-CM

## 2016-07-30 DIAGNOSIS — R338 Other retention of urine: Secondary | ICD-10-CM

## 2016-08-06 ENCOUNTER — Ambulatory Visit (INDEPENDENT_AMBULATORY_CARE_PROVIDER_SITE_OTHER): Payer: Medicare Other | Admitting: Urology

## 2016-08-06 DIAGNOSIS — R338 Other retention of urine: Secondary | ICD-10-CM

## 2016-08-06 DIAGNOSIS — R310 Gross hematuria: Secondary | ICD-10-CM | POA: Diagnosis not present

## 2016-08-06 DIAGNOSIS — N401 Enlarged prostate with lower urinary tract symptoms: Secondary | ICD-10-CM

## 2016-11-05 ENCOUNTER — Ambulatory Visit: Payer: Medicare Other | Admitting: Urology

## 2016-11-26 ENCOUNTER — Ambulatory Visit: Payer: Medicare Other | Admitting: Urology

## 2017-01-21 ENCOUNTER — Emergency Department (HOSPITAL_COMMUNITY)
Admission: EM | Admit: 2017-01-21 | Discharge: 2017-01-21 | Disposition: A | Discharge status: Home / Self Care | Payer: Medicare Other

## 2017-01-21 NOTE — ED Notes (Signed)
After patient was brought back to a treatment room, the patient informed this nurse that he was able to urinate adequately while he was waiting to come back.  Pt states he does not want to be seen now, but states he will definitely come back if he has further problems

## 2017-09-06 ENCOUNTER — Emergency Department (HOSPITAL_COMMUNITY)
Admission: EM | Admit: 2017-09-06 | Discharge: 2017-09-06 | Disposition: A | Discharge status: Home / Self Care | Payer: Medicare Other | Attending: Emergency Medicine | Admitting: Emergency Medicine

## 2017-09-06 ENCOUNTER — Encounter (HOSPITAL_COMMUNITY): Payer: Self-pay | Admitting: *Deleted

## 2017-09-06 DIAGNOSIS — R339 Retention of urine, unspecified: Secondary | ICD-10-CM | POA: Diagnosis not present

## 2017-09-06 DIAGNOSIS — Z85038 Personal history of other malignant neoplasm of large intestine: Secondary | ICD-10-CM | POA: Insufficient documentation

## 2017-09-06 DIAGNOSIS — Z79899 Other long term (current) drug therapy: Secondary | ICD-10-CM | POA: Insufficient documentation

## 2017-09-06 DIAGNOSIS — Z87891 Personal history of nicotine dependence: Secondary | ICD-10-CM | POA: Insufficient documentation

## 2017-09-06 LAB — URINALYSIS, ROUTINE W REFLEX MICROSCOPIC
Bilirubin Urine: NEGATIVE
Glucose, UA: NEGATIVE mg/dL
HGB URINE DIPSTICK: NEGATIVE
Ketones, ur: NEGATIVE mg/dL
Leukocytes, UA: NEGATIVE
Nitrite: NEGATIVE
Protein, ur: NEGATIVE mg/dL
SPECIFIC GRAVITY, URINE: 1.003 — AB (ref 1.005–1.030)
pH: 7 (ref 5.0–8.0)

## 2017-09-06 NOTE — ED Triage Notes (Signed)
pt reports prostate problems & unable to urinate.

## 2017-09-06 NOTE — ED Provider Notes (Signed)
[facility] EMERGENCY DEPARTMENT Provider Note   CSN: [id number] Arrival date & time: 09/06/17  0007  Time seen 12:50 AM   History   Chief Complaint Chief Complaint  Patient presents with  . Urinary Retention    HPI Albert Stewart is a 76 y.o. male.  HPI patient states October 28 he started noticing his urinary stream seems to be getting smaller.  Last night, October 29 about 5 PM he stopped being able to urinate at all.  He complains of some abdominal discomfort.  He states he had this before in the last time he had to have a Foley catheter inserted was about 4 years ago.  He was placed on Flomax which seemed to have helped.  He is followed by Dr. Jeffie Pollock, urologist.  He states about a year ago he was having difficulty urinating however he came to the ER he then was able to spontaneously void.  He was found to have a blood clot that his urologist flushed.  PCP Jake Samples, PA-C Urology Dr Jeffie Pollock  Past Medical History:  Diagnosis Date  . Cancer [facility])    colon    There are no active problems to display for this patient.   Past Surgical History:  Procedure Laterality Date  . COLON SURGERY         Home Medications    Prior to Admission medications   Medication Sig Start Date End Date Taking? Authorizing Provider  tamsulosin (FLOMAX) 0.4 MG CAPS capsule Take 1 capsule (0.4 mg total) by mouth daily after breakfast. 07/20/16  Yes Virgel Manifold, MD    Family History No family history on file.  Social History Social History  Substance Use Topics  . Smoking status: Former Research scientist (life sciences)  . Smokeless tobacco: Never Used  . Alcohol use Yes     Comment: daily 1/2 pint liquor  lives with wife   Allergies   Patient has no known allergies.   Review of Systems Review of Systems  All other systems reviewed and are negative.    Physical Exam Updated Vital Signs BP (!) 187/86 (BP Location: Right Arm)   Pulse 89   Temp 98.7 F (37.1 C) (Oral)   Resp 20   Ht 5'  10" (1.778 m)   Wt 84.8 kg (187 lb)   SpO2 98%   BMI 26.83 kg/m   Physical Exam  Constitutional: He appears well-developed and well-nourished.  HENT:  Head: Normocephalic and atraumatic.  Right Ear: External ear normal.  Left Ear: External ear normal.  Nose: Nose normal.  Eyes: Conjunctivae and EOM are normal.  Neck: Neck supple.  Cardiovascular: Normal rate.   Pulmonary/Chest: Effort normal. No respiratory distress.  Abdominal: Soft. Bowel sounds are normal. He exhibits no distension. There is no tenderness.  Examined after foley catheter inserted.   Nursing note and vitals reviewed.    ED Treatments / Results  Labs (all labs ordered are listed, but only abnormal results are displayed) Results for orders placed or performed during the hospital encounter of 09/06/17  Urinalysis, Routine w reflex microscopic  Result Value Ref Range   Color, Urine STRAW (A) YELLOW   APPearance CLEAR CLEAR   Specific Gravity, Urine 1.003 (L) 1.005 - 1.030   pH 7.0 5.0 - 8.0   Glucose, UA NEGATIVE NEGATIVE mg/dL   Hgb urine dipstick NEGATIVE NEGATIVE   Bilirubin Urine NEGATIVE NEGATIVE   Ketones, ur NEGATIVE NEGATIVE mg/dL   Protein, ur NEGATIVE NEGATIVE mg/dL   Nitrite NEGATIVE NEGATIVE  Leukocytes, UA NEGATIVE NEGATIVE   Laboratory interpretation all normal     EKG  EKG Interpretation None       Radiology No results found.  Procedures Procedures (including critical care time)  Medications Ordered in ED Medications - No data to display   Initial Impression / Assessment and Plan / ED Course  I have reviewed the triage vital signs and the nursing notes.  Pertinent labs & imaging results that were available during my care of the patient were reviewed by me and considered in my medical decision making (see chart for details).     Patient had a Foley catheter inserted and had about 650 cc of urine output.  At the time of my exam he had already had his catheter inserted  and stated he felt much improved.  Urinalysis was done to make sure that he did not have an underlying infection.  Final Clinical Impressions(s) / ED Diagnoses   Final diagnoses:  Urinary retention   Plan discharge  Rolland Porter, MD, Barbette Or, MD 09/06/17 [phone]

## 2017-09-06 NOTE — Discharge Instructions (Signed)
Call Dr Ralene Muskrat office in the morning to let him know you had to come to the ED to get a catheter placed because you couldn't pass your urine.  He will check you later in the week or early next week. Return to the ED if the catheter stops draining.

## 2017-09-13 ENCOUNTER — Ambulatory Visit: Payer: Medicare Other | Admitting: Urology

## 2017-09-13 DIAGNOSIS — R338 Other retention of urine: Secondary | ICD-10-CM

## 2017-09-13 DIAGNOSIS — N401 Enlarged prostate with lower urinary tract symptoms: Secondary | ICD-10-CM

## 2017-09-14 ENCOUNTER — Ambulatory Visit (INDEPENDENT_AMBULATORY_CARE_PROVIDER_SITE_OTHER): Payer: Medicare Other | Admitting: Urology

## 2017-09-14 DIAGNOSIS — N401 Enlarged prostate with lower urinary tract symptoms: Secondary | ICD-10-CM | POA: Diagnosis not present

## 2017-09-14 DIAGNOSIS — R338 Other retention of urine: Secondary | ICD-10-CM | POA: Diagnosis not present

## 2017-09-23 ENCOUNTER — Emergency Department (HOSPITAL_COMMUNITY): Payer: Medicare Other

## 2017-09-23 ENCOUNTER — Encounter (HOSPITAL_COMMUNITY): Payer: Self-pay

## 2017-09-23 ENCOUNTER — Emergency Department (HOSPITAL_COMMUNITY)
Admission: EM | Admit: 2017-09-23 | Discharge: 2017-09-23 | Disposition: A | Discharge status: Home / Self Care | Payer: Medicare Other | Attending: Emergency Medicine | Admitting: Emergency Medicine

## 2017-09-23 DIAGNOSIS — M79672 Pain in left foot: Secondary | ICD-10-CM | POA: Diagnosis not present

## 2017-09-23 DIAGNOSIS — W010XXA Fall on same level from slipping, tripping and stumbling without subsequent striking against object, initial encounter: Secondary | ICD-10-CM | POA: Diagnosis not present

## 2017-09-23 DIAGNOSIS — M545 Low back pain, unspecified: Secondary | ICD-10-CM

## 2017-09-23 DIAGNOSIS — M25562 Pain in left knee: Secondary | ICD-10-CM | POA: Diagnosis not present

## 2017-09-23 DIAGNOSIS — Z87891 Personal history of nicotine dependence: Secondary | ICD-10-CM | POA: Insufficient documentation

## 2017-09-23 DIAGNOSIS — Y92009 Unspecified place in unspecified non-institutional (private) residence as the place of occurrence of the external cause: Secondary | ICD-10-CM

## 2017-09-23 DIAGNOSIS — Z85038 Personal history of other malignant neoplasm of large intestine: Secondary | ICD-10-CM | POA: Diagnosis not present

## 2017-09-23 DIAGNOSIS — M25552 Pain in left hip: Secondary | ICD-10-CM | POA: Insufficient documentation

## 2017-09-23 DIAGNOSIS — W19XXXA Unspecified fall, initial encounter: Secondary | ICD-10-CM

## 2017-09-23 MED ORDER — OXYCODONE-ACETAMINOPHEN 5-325 MG PO TABS
1.0000 | ORAL_TABLET | Freq: Once | ORAL | Status: AC
  Administered 2017-09-23: 1 via ORAL
  Filled 2017-09-23: qty 1

## 2017-09-23 MED ORDER — METHOCARBAMOL 500 MG PO TABS: 500.0000 mg | ORAL_TABLET | Freq: Two times a day (BID) | ORAL | 0 refills | Status: AC | PRN

## 2017-09-23 MED ORDER — HYDROCODONE-ACETAMINOPHEN 5-325 MG PO TABS
ORAL_TABLET | ORAL | 0 refills | Status: AC
Start: 2017-09-23 — End: ?

## 2017-09-23 NOTE — Discharge Instructions (Signed)
Take the prescriptions as directed.  Apply moist heat or ice to the area(s) of discomfort, for 15 minutes at a time, several times per day for the next few days.  Do not fall asleep on a heating or ice pack.  Call your regular medical doctor today to schedule a follow up appointment next week.  Return to the Emergency Department immediately if worsening. ° °

## 2017-09-23 NOTE — ED Provider Notes (Signed)
[facility] EMERGENCY DEPARTMENT Provider Note   CSN: [id number] Arrival date & time: 09/23/17  1250     History   Chief Complaint Chief Complaint  Patient presents with  . Fall    HPI Albert Stewart is a 76 y.o. male.  HPI  Pt was seen at 1305. Per pt, c/o gradual onset and persistence of constant left hip "pain" for the past several months, worse over the past 1 week. Pt states his pain worsened after he "tripped in the yard." Pt c/o left sided LBP, left hip pain, left knee pain, left foot pain since the fall. Pt was ambulatory immediately after and since the fall. Pt has been taking APAP without improvement of his pain. Denies new injury, no abd pain, no N/V/D, no CP/SOB, no focal motor weakness, no tingling/numbness in extremities, no saddle anesthesia, no incont/retention of bowel or bladder, no neck pain, no head injury.    Past Medical History:  Diagnosis Date  . 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   Past Surgical History:  Procedure Laterality Date  . COLON SURGERY         Home Medications    Prior to Admission medications   Medication Sig Start Date End Date Taking? Authorizing Provider  tamsulosin (FLOMAX) 0.4 MG CAPS capsule Take 1 capsule (0.4 mg total) by mouth daily after breakfast. 07/20/16   Virgel Manifold, MD    Family History No family history on file.  Social History Social History   Tobacco Use  . Smoking status: Former Research scientist (life sciences)  . Smokeless tobacco: Never Used  Substance Use Topics  . Alcohol use: Yes    Comment: daily 1/2 pint liquor  . Drug use: No     Allergies   Patient has no known allergies.   Review of Systems Review of Systems ROS: Statement: All systems negative except as marked or noted in the HPI; Constitutional: Negative for fever and chills. ; ; Eyes: Negative for eye pain, redness and discharge. ; ; ENMT: Negative for ear pain, hoarseness, nasal congestion, sinus  pressure and sore throat. ; ; Cardiovascular: Negative for chest pain, palpitations, diaphoresis, dyspnea and peripheral edema. ; ; Respiratory: Negative for cough, wheezing and stridor. ; ; Gastrointestinal: Negative for nausea, vomiting, diarrhea, abdominal pain, blood in stool, hematemesis, jaundice and rectal bleeding. . ; ; Genitourinary: Negative for dysuria, flank pain and hematuria. ; ; Musculoskeletal: +LBP, left hip pain, left knee pain, left foot pain. Negative for neck pain. Negative for swelling and deformity.; ; Skin: Negative for pruritus, rash, abrasions, blisters, bruising and skin lesion.; ; Neuro: Negative for headache, lightheadedness and neck stiffness. Negative for weakness, altered level of consciousness, altered mental status, extremity weakness, paresthesias, involuntary movement, seizure and syncope.       Physical Exam Updated Vital Signs BP (!) 167/76 (BP Location: Right Arm)   Pulse 81   Temp (!) 97.5 F (36.4 C) (Oral)   Resp 18   Ht 5\' 10"  (1.778 m)   Wt 84.8 kg (187 lb)   SpO2 94%   BMI 26.83 kg/m   Physical Exam 1310: Physical examination:  Nursing notes reviewed; Vital signs and O2 SAT reviewed;  Constitutional: Well developed, Well nourished, Well hydrated, In no acute distress; Head:  Normocephalic, atraumatic; Eyes: EOMI, PERRL, No scleral icterus; ENMT: Mouth and pharynx normal, Mucous membranes moist; Neck: Supple, Full range of motion, No lymphadenopathy; Cardiovascular: Regular rate and  rhythm, No gallop; Respiratory: Breath sounds clear & equal bilaterally, No wheezes.  Speaking full sentences with ease, Normal respiratory effort/excursion; Chest: Nontender, Movement normal; Abdomen: Soft, Nontender, Nondistended, Normal bowel sounds; Genitourinary: No CVA tenderness; Spine:  No midline CS, TS, LS tenderness. +TTP left lumbar paraspinal muscles.;; Extremities: Pulses normal, Pelvis stable. +left hip tenderness to palp, no deformity. +FROM left knee,  including able to lift extended LLE off stretcher, and extend left lower leg against resistance.  No ligamentous laxity.  No patellar or quad tendon step-offs.  NMS intact left foot, strong pedal pp. +plantarflexion of left foot w/calf squeeze.  No palpable gap left Achilles's tendon.  No proximal fibular head tenderness.  No edema, erythema, warmth, ecchymosis or deformity.  No specific area of point tenderness. NT left ankle. +TTP left lateral dorsal 4th and 5th metatarsals, no edema, no deformity., No edema, No calf tenderness, edema or asymmetry.; Neuro: AA&Ox3, Major CN grossly intact.  Speech clear. No gross focal motor or sensory deficits in extremities. Climbs on and off stretcher easily by himself. Gait steady.; Skin: Color normal, Warm, Dry.   ED Treatments / Results  Labs (all labs ordered are listed, but only abnormal results are displayed)   EKG  EKG Interpretation None       Radiology   Procedures Procedures (including critical care time)  Medications Ordered in ED Medications  oxyCODONE-acetaminophen (PERCOCET/ROXICET) 5-325 MG per tablet 1 tablet (1 tablet Oral Given 09/23/17 1321)     Initial Impression / Assessment and Plan / ED Course  I have reviewed the triage vital signs and the nursing notes.  Pertinent labs & imaging results that were available during my care of the patient were reviewed by me and considered in my medical decision making (see chart for details).  MDM Reviewed: previous chart, nursing note and vitals Interpretation: x-ray    Dg Lumbar Spine Complete Result Date: 09/23/2017 CLINICAL DATA:  Low back pain after fall last week. EXAM: LUMBAR SPINE - COMPLETE 4+ VIEW COMPARISON:  None. FINDINGS: There is no evidence of lumbar spine fracture. Alignment is normal. Intervertebral disc spaces are maintained. Atherosclerosis of thoracic aorta is noted. IMPRESSION: Normal lumbar spine.  Aortic atherosclerosis. Electronically Signed   By: Marijo Conception, M.D.   On: 09/23/2017 14:34   Dg Ankle Complete Left Result Date: 09/23/2017 CLINICAL DATA:  Left ankle pain after fall last week. EXAM: LEFT ANKLE COMPLETE - 3+ VIEW COMPARISON:  None. FINDINGS: There is no evidence of fracture, dislocation, or joint effusion. There is no evidence of arthropathy or other focal bone abnormality. Soft tissues are unremarkable. IMPRESSION: Normal left ankle. Electronically Signed   By: Marijo Conception, M.D.   On: 09/23/2017 14:35   Dg Knee Complete 4 Views Left Result Date: 09/23/2017 CLINICAL DATA:  Left knee pain after fall last week. EXAM: LEFT KNEE - COMPLETE 4+ VIEW COMPARISON:  None. FINDINGS: No evidence of fracture, dislocation, or joint effusion. No evidence of arthropathy or other focal bone abnormality. Vascular calcifications are noted. IMPRESSION: No significant abnormality seen in the left knee. Electronically Signed   By: Marijo Conception, M.D.   On: 09/23/2017 14:31   Dg Foot Complete Left Result Date: 09/23/2017 CLINICAL DATA:  Fall 1 week ago EXAM: LEFT FOOT - COMPLETE 3+ VIEW COMPARISON:  None. FINDINGS: Bone density is normal. There is no acute fracture or dislocation of the left foot. No advanced osteoarthrosis. There is atherosclerotic calcification of the dorsalis pedis and posterior tibial  arteries. No ankle effusion or soft tissue swelling. IMPRESSION: No acute abnormality of the left foot. Electronically Signed   By: Ulyses Jarred M.D.   On: 09/23/2017 14:30   Dg Hip Unilat With Pelvis 2-3 Views Left  Result Date: 09/23/2017 CLINICAL DATA:  Left hip pain after fall last week. EXAM: DG HIP (WITH OR WITHOUT PELVIS) 2-3V LEFT COMPARISON:  None. FINDINGS: There is no evidence of hip fracture or dislocation. There is no evidence of arthropathy or other focal bone abnormality. IMPRESSION: Normal left hip. Electronically Signed   By: Marijo Conception, M.D.   On: 09/23/2017 14:32    1500:  Pt has been on and off the stretcher and ambulatory  around the exam room and the ED nurses' station without distress. XR reassuring. Tx symptomatically at this time. Dx and testing d/w pt.  Questions answered.  Verb understanding, agreeable to d/c home with outpt f/u.   Final Clinical Impressions(s) / ED Diagnoses   Final diagnoses:  None    ED Discharge Orders    None       Francine Graven, DO 09/26/17 2049

## 2017-09-23 NOTE — ED Triage Notes (Signed)
Pt reports has had pain in left hip for months but pain worse after he tripped and fell in the yard last week. Has been taking extra strength tylenol without relief.

## 2017-09-23 NOTE — ED Notes (Signed)
Pt ambulated around nurses station with no difficulty. 

## 2017-10-18 ENCOUNTER — Ambulatory Visit: Payer: Medicare Other | Admitting: Urology

## 2017-12-17 ENCOUNTER — Emergency Department (HOSPITAL_COMMUNITY)
Admission: EM | Admit: 2017-12-17 | Discharge: 2017-12-17 | Disposition: A | Discharge status: Home / Self Care | Payer: Medicare Other | Attending: Emergency Medicine | Admitting: Emergency Medicine

## 2017-12-17 DIAGNOSIS — R339 Retention of urine, unspecified: Secondary | ICD-10-CM

## 2017-12-17 DIAGNOSIS — R1030 Lower abdominal pain, unspecified: Secondary | ICD-10-CM | POA: Diagnosis not present

## 2017-12-17 DIAGNOSIS — K4091 Unilateral inguinal hernia, without obstruction or gangrene, recurrent: Secondary | ICD-10-CM | POA: Insufficient documentation

## 2017-12-17 DIAGNOSIS — Z87891 Personal history of nicotine dependence: Secondary | ICD-10-CM | POA: Insufficient documentation

## 2017-12-17 DIAGNOSIS — Z79899 Other long term (current) drug therapy: Secondary | ICD-10-CM | POA: Diagnosis not present

## 2017-12-17 DIAGNOSIS — J441 Chronic obstructive pulmonary disease with (acute) exacerbation: Secondary | ICD-10-CM

## 2017-12-17 DIAGNOSIS — Z85038 Personal history of other malignant neoplasm of large intestine: Secondary | ICD-10-CM | POA: Insufficient documentation

## 2017-12-17 DIAGNOSIS — R062 Wheezing: Secondary | ICD-10-CM | POA: Diagnosis not present

## 2017-12-17 LAB — URINALYSIS, ROUTINE W REFLEX MICROSCOPIC
Bilirubin Urine: NEGATIVE
GLUCOSE, UA: NEGATIVE mg/dL
HGB URINE DIPSTICK: NEGATIVE
KETONES UR: NEGATIVE mg/dL
Leukocytes, UA: NEGATIVE
Nitrite: NEGATIVE
Protein, ur: NEGATIVE mg/dL
Specific Gravity, Urine: 1.003 — ABNORMAL LOW (ref 1.005–1.030)
pH: 6 (ref 5.0–8.0)

## 2017-12-17 MED ORDER — IPRATROPIUM-ALBUTEROL 0.5-2.5 (3) MG/3ML IN SOLN
3.0000 mL | Freq: Once | RESPIRATORY_TRACT | Status: AC
  Administered 2017-12-17: 3 mL via RESPIRATORY_TRACT
  Filled 2017-12-17: qty 3

## 2017-12-17 MED ORDER — PREDNISONE 50 MG PO TABS: ORAL_TABLET | ORAL | 0 refills | Status: AC

## 2017-12-17 MED ORDER — ALBUTEROL SULFATE HFA 108 (90 BASE) MCG/ACT IN AERS: 2.0000 | INHALATION_SPRAY | Freq: Four times a day (QID) | RESPIRATORY_TRACT | 0 refills | Status: AC | PRN

## 2017-12-17 MED ORDER — ALBUTEROL SULFATE HFA 108 (90 BASE) MCG/ACT IN AERS
2.0000 | INHALATION_SPRAY | Freq: Once | RESPIRATORY_TRACT | Status: AC
  Administered 2017-12-17: 2 via RESPIRATORY_TRACT
  Filled 2017-12-17: qty 6.7

## 2017-12-17 MED ORDER — PREDNISONE 50 MG PO TABS
60.0000 mg | ORAL_TABLET | Freq: Once | ORAL | Status: AC
  Administered 2017-12-17: 60 mg via ORAL
  Filled 2017-12-17: qty 1

## 2017-12-17 NOTE — Discharge Instructions (Signed)
Keep the catheter in place and follow-up with urology.  Take the steroids as prescribed for your breathing and stop smoking.  Return to the ED if you develop new or worsening symptoms.

## 2017-12-17 NOTE — ED Triage Notes (Signed)
Unable to urinate x 3 hours, states this is the 3rd time with this problem

## 2017-12-17 NOTE — ED Provider Notes (Signed)
[facility] EMERGENCY DEPARTMENT Provider Note   CSN: [id number] Arrival date & time: 12/17/17  0143     History   Chief Complaint Chief Complaint  Patient presents with  . Urinary Retention    HPI Albert Stewart is a 77 y.o. male.  Patient reports not being able to urinate since 3 PM.  Complains of lower abdominal pain and urge to urinate.  He has had this problem twice in the past and required catheter placement.  Denies any fever, chills, nausea or vomiting.  he denies any testicular pain.  Denies any diarrhea or constipation.  No chest pain or shortness of breath.   The history is provided by the patient.    Past Medical History:  Diagnosis Date  . 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   Past Surgical History:  Procedure Laterality Date  . COLON SURGERY         Home Medications    Prior to Admission medications   Medication Sig Start Date End Date Taking? Authorizing Provider  acetaminophen (TYLENOL) 500 MG tablet Take 1,000 mg every 6 (six) hours as needed by mouth for moderate pain.    [provider]  HYDROcodone-acetaminophen (NORCO/VICODIN) 5-325 MG tablet 1 tab PO q12 hours prn pain 09/23/17   Francine Graven, DO  latanoprost (XALATAN) 0.005 % ophthalmic solution Place 1 drop at bedtime into both eyes. 08/29/17   [provider]  methocarbamol (ROBAXIN) 500 MG tablet Take 1 tablet (500 mg total) 2 (two) times daily as needed by mouth for muscle spasms. 09/23/17   Francine Graven, DO  tamsulosin (FLOMAX) 0.4 MG CAPS capsule Take 1 capsule (0.4 mg total) by mouth daily after breakfast. Patient taking differently: Take 0.4 mg daily after supper by mouth.  07/20/16   Virgel Manifold, MD    Family History No family history on file.  Social History Social History   Tobacco Use  . Smoking status: Former Research scientist (life sciences)  . Smokeless tobacco: Never Used  Substance Use Topics  . Alcohol use: Yes   Comment: daily 1/2 pint liquor  . Drug use: No     Allergies   Patient has no known allergies.   Review of Systems Review of Systems  Constitutional: Negative for appetite change and fever.  Respiratory: Negative for chest tightness and shortness of breath.   Cardiovascular: Negative for chest pain and leg swelling.  Gastrointestinal: Positive for abdominal pain. Negative for nausea and vomiting.  Genitourinary: Positive for difficulty urinating. Negative for flank pain, hematuria, penile pain and testicular pain.  Musculoskeletal: Negative for arthralgias and myalgias.  Skin: Negative for rash.  Neurological: Negative for dizziness, weakness and numbness.    all other systems are negative except as noted in the HPI and PMH.    Physical Exam Updated Vital Signs BP (!) 204/92 (BP Location: Right Arm)   Pulse 80   Temp (!) 97.5 F (36.4 C) (Oral)   Resp 20   SpO2 91%   Physical Exam  Constitutional: He is oriented to person, place, and time. He appears well-developed and well-nourished. No distress.  uncomfortable  HENT:  Head: Normocephalic and atraumatic.  Mouth/Throat: Oropharynx is clear and moist. No oropharyngeal exudate.  Eyes: Conjunctivae and EOM are normal. Pupils are equal, round, and reactive to light.  Neck: Normal range of motion. Neck supple.  No meningismus.  Cardiovascular: Normal rate, regular rhythm, normal heart sounds and intact distal pulses.  No murmur heard. Pulmonary/Chest: Effort normal. No respiratory distress. He has wheezes.  Abdominal: Soft. There is tenderness. There is no rebound and no guarding.  Suprapubic tenderness Reducible L inguinal hernia.  Genitourinary:  Genitourinary Comments: No testicular tenderness Uncircumsized. Foreskin placed back after Foley placement  Musculoskeletal: Normal range of motion. He exhibits no edema or tenderness.  Neurological: He is alert and oriented to person, place, and time. No cranial nerve  deficit. He exhibits normal muscle tone. Coordination normal.  No ataxia on finger to nose bilaterally. No pronator drift. 5/5 strength throughout. CN 2-12 intact.Equal grip strength. Sensation intact.   Skin: Skin is warm.  Psychiatric: He has a normal mood and affect. His behavior is normal.  Nursing note and vitals reviewed.    ED Treatments / Results  Labs (all labs ordered are listed, but only abnormal results are displayed) Labs Reviewed  URINALYSIS, ROUTINE W REFLEX MICROSCOPIC - Abnormal; Notable for the following components:      Result Value   Color, Urine STRAW (*)    Specific Gravity, Urine 1.003 (*)    All other components within normal limits  URINE CULTURE    EKG  EKG Interpretation None       Radiology No results found.  Procedures Procedures (including critical care time)  Medications Ordered in ED Medications - No data to display   Initial Impression / Assessment and Plan / ED Course  I have reviewed the triage vital signs and the nursing notes.  Pertinent labs & imaging results that were available during my care of the patient were reviewed by me and considered in my medical decision making (see chart for details).    Urinary retention. No infectious symptoms. Hypertensive and uncomfortable.   >660 mL on bladder scan.  Patient feels improved after catheter placement.  Urinalysis negative for infection.  He is given a breathing treatment for his wheezing.  Smoking cessation encouraged.  Will discharge with urology follow-up.  Return precautions discussed. Will treat probable COPD exacerbation with albuterol and steroids.   Final Clinical Impressions(s) / ED Diagnoses   Final diagnoses:  Urinary retention  COPD exacerbation [facility])    ED Discharge Orders    None       Wilver Tignor, Annie Main, MD 12/17/17 [id number]

## 2017-12-18 LAB — URINE CULTURE: CULTURE: NO GROWTH

## 2018-01-03 ENCOUNTER — Ambulatory Visit: Payer: Medicare Other | Admitting: Urology

## 2018-01-03 ENCOUNTER — Encounter (HOSPITAL_COMMUNITY): Payer: Self-pay | Admitting: Emergency Medicine

## 2018-01-03 ENCOUNTER — Other Ambulatory Visit: Payer: Self-pay

## 2018-01-03 DIAGNOSIS — N401 Enlarged prostate with lower urinary tract symptoms: Secondary | ICD-10-CM | POA: Diagnosis not present

## 2018-01-03 DIAGNOSIS — Z85038 Personal history of other malignant neoplasm of large intestine: Secondary | ICD-10-CM | POA: Diagnosis not present

## 2018-01-03 DIAGNOSIS — R351 Nocturia: Secondary | ICD-10-CM | POA: Diagnosis not present

## 2018-01-03 DIAGNOSIS — R339 Retention of urine, unspecified: Secondary | ICD-10-CM | POA: Insufficient documentation

## 2018-01-03 DIAGNOSIS — Z87891 Personal history of nicotine dependence: Secondary | ICD-10-CM | POA: Diagnosis not present

## 2018-01-03 DIAGNOSIS — Z79899 Other long term (current) drug therapy: Secondary | ICD-10-CM | POA: Insufficient documentation

## 2018-01-03 DIAGNOSIS — R338 Other retention of urine: Secondary | ICD-10-CM | POA: Diagnosis not present

## 2018-01-03 DIAGNOSIS — R972 Elevated prostate specific antigen [PSA]: Secondary | ICD-10-CM

## 2018-01-03 NOTE — ED Triage Notes (Signed)
Pt states he has not urinated since having procedure today. Pt states he has not urinated since this evening.

## 2018-01-04 ENCOUNTER — Emergency Department (HOSPITAL_COMMUNITY)
Admission: EM | Admit: 2018-01-04 | Discharge: 2018-01-04 | Disposition: A | Discharge status: Home / Self Care | Payer: Medicare Other | Attending: Emergency Medicine | Admitting: Emergency Medicine

## 2018-01-04 DIAGNOSIS — R339 Retention of urine, unspecified: Secondary | ICD-10-CM

## 2018-01-04 NOTE — ED Provider Notes (Addendum)
[facility] EMERGENCY DEPARTMENT Provider Note   CSN: [id number] Arrival date & time: 01/03/18  2338     History   Chief Complaint Chief Complaint  Patient presents with  . Urinary Retention    HPI Albert Stewart is a 77 y.o. male.  Patient is a 77 year old male with history of enlarged prostate.  He presents with urinary retention.  He tells me that he had a cystoscopy performed earlier today.  After returning home, he was unable to void.  He denies any fevers or chills.  He denies any injury or trauma.  He has Flomax at home, however this has not helped.   The history is provided by the patient.    Past Medical History:  Diagnosis Date  . 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   Past Surgical History:  Procedure Laterality Date  . COLON SURGERY         Home Medications    Prior to Admission medications   Medication Sig Start Date End Date Taking? Authorizing Provider  acetaminophen (TYLENOL) 500 MG tablet Take 1,000 mg every 6 (six) hours as needed by mouth for moderate pain.    [provider]  albuterol (PROVENTIL HFA;VENTOLIN HFA) 108 (90 Base) MCG/ACT inhaler Inhale 2 puffs into the lungs every 6 (six) hours as needed. 12/17/17   Rancour, Annie Main, MD  HYDROcodone-acetaminophen (NORCO/VICODIN) 5-325 MG tablet 1 tab PO q12 hours prn pain 09/23/17   Francine Graven, DO  latanoprost (XALATAN) 0.005 % ophthalmic solution Place 1 drop at bedtime into both eyes. 08/29/17   [provider]  methocarbamol (ROBAXIN) 500 MG tablet Take 1 tablet (500 mg total) 2 (two) times daily as needed by mouth for muscle spasms. 09/23/17   Francine Graven, DO  predniSONE (DELTASONE) 50 MG tablet 1 tablet PO daily 12/17/17   Rancour, Annie Main, MD  tamsulosin (FLOMAX) 0.4 MG CAPS capsule Take 1 capsule (0.4 mg total) by mouth daily after breakfast. Patient taking differently: Take 0.4 mg daily after supper by mouth.   07/20/16   Virgel Manifold, MD    Family History No family history on file.  Social History Social History   Tobacco Use  . Smoking status: Former Research scientist (life sciences)  . Smokeless tobacco: Never Used  Substance Use Topics  . Alcohol use: Yes    Comment: daily 1/2 pint liquor  . Drug use: No     Allergies   Patient has no known allergies.   Review of Systems Review of Systems  All other systems reviewed and are negative.    Physical Exam Updated Vital Signs BP (!) 210/107 (BP Location: Right Arm)   Pulse 92   Temp 98 F (36.7 C) (Oral)   Resp (!) 22   Ht 5\' 9"  (1.753 m)   Wt 83.9 kg (185 lb)   SpO2 96%   BMI 27.32 kg/m   Physical Exam  Constitutional: He is oriented to person, place, and time. He appears well-developed and well-nourished. No distress.  HENT:  Head: Normocephalic and atraumatic.  Mouth/Throat: Oropharynx is clear and moist.  Neck: Normal range of motion. Neck supple.  Cardiovascular: Normal rate and regular rhythm. Exam reveals no friction rub.  No murmur heard. Pulmonary/Chest: Effort normal and breath sounds normal. No respiratory distress. He has no wheezes. He has no rales.  Abdominal: Soft. Bowel sounds are normal. He exhibits no distension. There is no tenderness.  Musculoskeletal: Normal range of  motion. He exhibits no edema.  Neurological: He is alert and oriented to person, place, and time. Coordination normal.  Skin: Skin is warm and dry. He is not diaphoretic.  Nursing note and vitals reviewed.    ED Treatments / Results  Labs (all labs ordered are listed, but only abnormal results are displayed) Labs Reviewed - No data to display  EKG  EKG Interpretation None       Radiology No results found.  Procedures Procedures (including critical care time)  Medications Ordered in ED Medications - No data to display   Initial Impression / Assessment and Plan / ED Course  I have reviewed the triage vital signs and the nursing  notes.  Pertinent labs & imaging results that were available during my care of the patient were reviewed by me and considered in my medical decision making (see chart for details).  Foley catheter placed with 600 cc of clear yellow urine obtained.  This produced relief of his symptoms.  He will call his urologist tomorrow to arrange a follow-up appointment.  A Foley catheter will be left in place and connected to a leg bag.  Final Clinical Impressions(s) / ED Diagnoses   Final diagnoses:  None    ED Discharge Orders    None       Veryl Speak, MD 01/04/18 [id number]    Veryl Speak, MD 01/09/18 [phone]

## 2018-01-04 NOTE — Discharge Instructions (Signed)
Call your urologist tomorrow to arrange a follow-up appointment.

## 2018-01-31 ENCOUNTER — Other Ambulatory Visit: Payer: Self-pay | Admitting: Urology

## 2018-01-31 ENCOUNTER — Ambulatory Visit: Payer: Medicare Other | Admitting: Urology

## 2018-01-31 DIAGNOSIS — N401 Enlarged prostate with lower urinary tract symptoms: Secondary | ICD-10-CM | POA: Diagnosis not present

## 2018-01-31 DIAGNOSIS — R972 Elevated prostate specific antigen [PSA]: Secondary | ICD-10-CM | POA: Diagnosis not present

## 2018-01-31 DIAGNOSIS — R3914 Feeling of incomplete bladder emptying: Secondary | ICD-10-CM | POA: Diagnosis not present

## 2018-01-31 DIAGNOSIS — R3912 Poor urinary stream: Secondary | ICD-10-CM

## 2018-02-21 ENCOUNTER — Encounter (HOSPITAL_COMMUNITY): Payer: Self-pay

## 2018-02-21 ENCOUNTER — Ambulatory Visit (HOSPITAL_COMMUNITY): Payer: Medicare Other

## 2018-04-19 ENCOUNTER — Emergency Department (HOSPITAL_COMMUNITY)
Admission: EM | Admit: 2018-04-19 | Discharge: 2018-05-08 | Disposition: E | Payer: Medicare Other | Attending: Emergency Medicine | Admitting: Emergency Medicine

## 2018-04-19 DIAGNOSIS — I469 Cardiac arrest, cause unspecified: Secondary | ICD-10-CM | POA: Diagnosis present

## 2018-04-19 DIAGNOSIS — Z87891 Personal history of nicotine dependence: Secondary | ICD-10-CM | POA: Diagnosis not present

## 2018-04-19 DIAGNOSIS — Z79899 Other long term (current) drug therapy: Secondary | ICD-10-CM | POA: Diagnosis not present

## 2018-04-20 MED FILL — Medication: Qty: 1 | Status: AC

## 2018-05-08 NOTE — ED Triage Notes (Signed)
Pt came in CPR in progress, pt had in king airway, right leg IO, CPR initiated at 02/13/39 5 rounds of EPI given by ems prior to arrival. Pt had pulses for aprox. 7 minutes then compressions resumed. History of COPD. Epi given at 02-13-31 compressions continued, pt in asystole, pt intubated by dr. Tomi Bamberger with 7.5 ET tube simultaneously, ultrasound of heart done at bedside by DR Ray with no cardiac activity. Time of death called at 13-Feb-2132 by doctor Hillard Danker EDP

## 2018-05-08 NOTE — ED Provider Notes (Signed)
[facility] EMERGENCY DEPARTMENT Provider Note   CSN: [id number] Arrival date & time: 05/03/2018  2129    Level 5 caveat: Cardiac arrest History   Chief Complaint Cardiac arrest  HPI Albert Stewart is a 77 y.o. male.  HPI Patient presented to the emergency room in cardiac arrest.  According to the EMS report patient was found unresponsive at home.  Patient's son states that his mother passed away 1 month ago.  Patient had taken this very hard.  He was not eating well.  He has been drinking more alcohol than usual.  His son went to the store when he came back EMS was at the house.  Family members had found the patient unresponsive.  EMS performed CPR.  Patient was given 5 rounds of epi prior to arrival.  He Combi tube airway was placed.  He had brief return of spontaneous circulation for a few minutes but then he went back into cardiac arrest.  Patient arrived to the ED with facial cyanosis. Past Medical History:  Diagnosis Date  . 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   Past Surgical History:  Procedure Laterality Date  . COLON SURGERY          Home Medications    Prior to Admission medications   Medication Sig Start Date End Date Taking? Authorizing Provider  acetaminophen (TYLENOL) 500 MG tablet Take 1,000 mg every 6 (six) hours as needed by mouth for moderate pain.    [provider]  albuterol (PROVENTIL HFA;VENTOLIN HFA) 108 (90 Base) MCG/ACT inhaler Inhale 2 puffs into the lungs every 6 (six) hours as needed. 12/17/17   Rancour, Annie Main, MD  HYDROcodone-acetaminophen (NORCO/VICODIN) 5-325 MG tablet 1 tab PO q12 hours prn pain 09/23/17   Francine Graven, DO  latanoprost (XALATAN) 0.005 % ophthalmic solution Place 1 drop at bedtime into both eyes. 08/29/17   [provider]  methocarbamol (ROBAXIN) 500 MG tablet Take 1 tablet (500 mg total) 2 (two) times daily as needed by mouth for muscle spasms. 09/23/17    Francine Graven, DO  predniSONE (DELTASONE) 50 MG tablet 1 tablet PO daily 12/17/17   Rancour, Annie Main, MD  tamsulosin (FLOMAX) 0.4 MG CAPS capsule Take 1 capsule (0.4 mg total) by mouth daily after breakfast. Patient taking differently: Take 0.4 mg daily after supper by mouth.  07/20/16   Virgel Manifold, MD    Family History No family history on file.  Social History Social History   Tobacco Use  . Smoking status: Former Research scientist (life sciences)  . Smokeless tobacco: Never Used  Substance Use Topics  . Alcohol use: Yes    Comment: daily 1/2 pint liquor  . Drug use: No     Allergies   Patient has no known allergies.   Review of Systems Review of Systems  Unable to perform ROS: Patient unresponsive     Physical Exam Updated Vital Signs There were no vitals taken for this visit.  Physical Exam  Constitutional:  Patient appears cyanotic unresponsive  HENT:  Head: Normocephalic and atraumatic.  Right Ear: External ear normal.  Left Ear: External ear normal.  Copious clear secretions in the oropharynx  Eyes:   pupils unresponsive, no icterus  Neck: No JVD present. No tracheal deviation present. No thyromegaly present.  Cardiovascular:  Absent heart sounds, no pulses  Pulmonary/Chest:  Equal breath sounds with bag tube ventilation  Abdominal: Soft. He exhibits no distension and no mass.  Musculoskeletal: He exhibits no edema or deformity.  Neurological: GCS eye subscore is 1. GCS verbal subscore is 1. GCS motor subscore is 1.  Skin: There is pallor.     ED Treatments / Results   Procedures Procedure Name: Intubation Date/Time: April 28, 2018 9:55 PM Performed by: Dorie Rank, MD Pre-anesthesia Checklist: Patient identified, Patient being monitored, Timeout performed and Suction available Oxygen Delivery Method: Ambu bag Preoxygenation: Pre-oxygenation with 100% oxygen Laryngoscope Size: Glidescope and 4 Grade View: Grade II Tube size: 7.5 mm Number of attempts: 1 Placement  Confirmation: ETT inserted through vocal cords under direct vision,  CO2 detector and Breath sounds checked- equal and bilateral      (including critical care time)  Medications Ordered in ED Medications - No data to display   Initial Impression / Assessment and Plan / ED Course  I have reviewed the triage vital signs and the nursing notes.  Pertinent labs & imaging results that were available during my care of the patient were reviewed by me and considered in my medical decision making (see chart for details).  Clinical Course as of Apr 19 2156  Wed 04-28-2018 I discussed case with Dr. Gerarda Fraction.  We can for the death certificate to their office   [JK]    Clinical Course User Index [JK] Dorie Rank, MD  CPR was continued in the ED.  Bedside echo was performed by Dr. Jeanell Sparrow.  No cardiac activity was noted.  Patient was intubated to ensure adequate oxygenation.  Patient had been receiving CPR for approximately 50 minutes.  He was asystolic and appeared cyanotic.  CPR efforts were discontinued.  Patient was declared dead at 02/13/2132 hrs.  Final Clinical Impressions(s) / ED Diagnoses   Final diagnoses:  Cardiac arrest [facility])      Dorie Rank, MD 04/28/2018 February 13, 2156

## 2018-05-08 NOTE — ED Notes (Signed)
Patient's son has patient's wallet. Pt no longer has valuables with the body.

## 2018-05-08 DEATH — deceased
